# Patient Record
Sex: Male | Born: 1990 | Race: White | Hispanic: No | Marital: Single | Smoking: Never smoker
Health system: Southern US, Community
[De-identification: ages and names within clinical notes are randomized; demographics above are authoritative.]

## PROBLEM LIST (undated history)

## (undated) HISTORY — PX: TONSILLECTOMY: SUR1361

---
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
## 2011-06-24 ENCOUNTER — Emergency Department (HOSPITAL_COMMUNITY): Payer: Medicaid Other

## 2011-06-24 ENCOUNTER — Emergency Department (HOSPITAL_COMMUNITY)
Admission: EM | Admit: 2011-06-24 | Discharge: 2011-06-25 | Disposition: A | Discharge status: Home / Self Care | Payer: Medicaid Other | Attending: Emergency Medicine | Admitting: Emergency Medicine

## 2011-06-24 ENCOUNTER — Encounter: Payer: Self-pay | Admitting: Emergency Medicine

## 2011-06-24 DIAGNOSIS — T148XXA Other injury of unspecified body region, initial encounter: Secondary | ICD-10-CM | POA: Insufficient documentation

## 2011-06-24 DIAGNOSIS — IMO0002 Reserved for concepts with insufficient information to code with codable children: Secondary | ICD-10-CM | POA: Insufficient documentation

## 2011-06-24 DIAGNOSIS — S0990XA Unspecified injury of head, initial encounter: Secondary | ICD-10-CM | POA: Insufficient documentation

## 2011-06-24 MED ORDER — TETANUS-DIPHTH-ACELL PERTUSSIS 5-2.5-18.5 LF-MCG/0.5 IM SUSP
0.5000 mL | Freq: Once | INTRAMUSCULAR | Status: DC
  Filled 2011-06-24: qty 0.5

## 2011-06-24 NOTE — ED Notes (Signed)
Patient has abrasions noted to left temple area and left side of chin reports pain in bilat hands with small abrasions noted and pain in bilat knees.
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
## 2011-06-25 ENCOUNTER — Encounter (HOSPITAL_COMMUNITY): Payer: Self-pay | Admitting: Emergency Medicine

## 2011-06-25 ENCOUNTER — Emergency Department (HOSPITAL_COMMUNITY): Payer: Medicaid Other

## 2011-06-25 MED ORDER — IBUPROFEN 800 MG PO TABS
800.0000 mg | ORAL_TABLET | Freq: Once | ORAL | Status: AC
  Administered 2011-06-25: 800 mg via ORAL
  Filled 2011-06-25: qty 1

## 2011-06-25 NOTE — ED Notes (Signed)
Patient downgraded not trauma per Dr. Manus Gunning

## 2011-06-25 NOTE — ED Notes (Signed)
Abrasions to face chin bilat hands cleaned with NS and dressed with Telfa and tape

## 2016-06-09 ENCOUNTER — Ambulatory Visit (INDEPENDENT_AMBULATORY_CARE_PROVIDER_SITE_OTHER): Payer: 59 | Admitting: Podiatry

## 2016-06-09 ENCOUNTER — Encounter: Payer: Self-pay | Admitting: Podiatry

## 2016-06-09 ENCOUNTER — Ambulatory Visit (INDEPENDENT_AMBULATORY_CARE_PROVIDER_SITE_OTHER): Payer: 59

## 2016-06-09 VITALS — BP 124/83 | HR 71 | Resp 16

## 2016-06-09 DIAGNOSIS — M79671 Pain in right foot: Secondary | ICD-10-CM

## 2016-06-09 DIAGNOSIS — B079 Viral wart, unspecified: Secondary | ICD-10-CM

## 2016-06-09 NOTE — Progress Notes (Signed)
   Subjective:    Patient ID: Adam Dixon, male    DOB: [birth year], 25 y.o.   MRN: [id number]  HPI Chief Complaint  Patient presents with  . Foot Pain    Plantar heel right - small callused area that has become painful over the last 2 months, thick and larger now, no treatment      Review of Systems  Respiratory: Positive for cough.   Allergic/Immunologic: Positive for environmental allergies.  All other systems reviewed and are negative.      Objective:   Physical Exam        Assessment & Plan:

## 2016-06-11 NOTE — Progress Notes (Signed)
Subjective:     Patient ID: Adam BisonNicholas I Dixon, male   DOB: [birth year], 25 y.o.   MRN: [id number]  HPI patient presents with a painful lesion on the plantar aspect of the right foot that he has tried to trim and pad without relief and is been present for a number of months   Review of Systems  All other systems reviewed and are negative.      Objective:   Physical Exam  Constitutional: He is oriented to person, place, and time.  Cardiovascular: Intact distal pulses.   Musculoskeletal: Normal range of motion.  Neurological: He is oriented to person, place, and time.  Skin: Skin is warm.  Nursing note and vitals reviewed.  neurovascular status found to be intact muscle strength adequate range of motion within normal limits with patient noted to have plantar keratotic lesion measuring approximately 1.3 cm x 7 mm that's painful when pressed from a lateral direction with pinpoint bleeding noted upon debridement. Patient's found have good digital perfusion well oriented 3     Assessment:     Verruca plantaris plantar aspect right foot painful and hard for him to walk with    Plan:     Discussed treatment options and I recommended excision. I reviewed the procedure with patient and explained risk and patient wants surgery and today I infiltrated the area with 60 mg Xylocaine with epinephrine applied sterile prep to the area and then utilizing sterile instrumentation remove the mass in toto and took it down to the epidermal dermal junction and applied a small amount of phenol to kill any remaining cells. I placed in formalin sent for pathological evaluation and applied sterile dressing

## 2016-12-05 ENCOUNTER — Ambulatory Visit (INDEPENDENT_AMBULATORY_CARE_PROVIDER_SITE_OTHER): Payer: 59 | Admitting: Family

## 2016-12-05 ENCOUNTER — Encounter (INDEPENDENT_AMBULATORY_CARE_PROVIDER_SITE_OTHER): Payer: Self-pay | Admitting: Family

## 2016-12-05 ENCOUNTER — Ambulatory Visit (INDEPENDENT_AMBULATORY_CARE_PROVIDER_SITE_OTHER): Payer: 59

## 2016-12-05 VITALS — Ht 70.0 in | Wt 170.0 lb

## 2016-12-05 DIAGNOSIS — M25562 Pain in left knee: Secondary | ICD-10-CM

## 2016-12-05 DIAGNOSIS — M25862 Other specified joint disorders, left knee: Secondary | ICD-10-CM

## 2016-12-05 DIAGNOSIS — G8929 Other chronic pain: Secondary | ICD-10-CM

## 2016-12-05 NOTE — Progress Notes (Signed)
   Office Visit Note   Patient: Adam Dixon           Date of Birth: [birth year]           MRN: [id number] Visit Date: 12/05/2016              Requested by: Lonie Peakonroy, Nathan, PA-C [address] [place], [place] [id number] PCP: Lonie Peakonroy, Nathan, PA-C  Chief Complaint  Patient presents with  . Left Knee - Pain      HPI: The patient is a 26 year old gentleman who presents today complaining of left knee pain that is been ongoing for quite some time. States it is anterior no known injury. No swelling. States pain with just some activities at times has pain with ambulation following knee pain. Points to an area where there is some "swelling" proximal to the patella. This is nontender. Does states if he hits it with something heavy this will reproduce his pain and symptoms, will have pain with rom and weight bearing following stricking the cyst.   Assessment & Plan: Visit Diagnoses:  1. Chronic pain of left knee     Plan: reassurance provided. Will consider risks and benefits of surgery. He would like to consider surgical excision. Return as needed.   Follow-Up Instructions: No Follow-up on file.   Left Knee Exam   Tenderness  The patient is experiencing no tenderness.     Range of Motion  The patient has normal left knee ROM.  Tests  Varus: negative Valgus: negative  Other  Erythema: absent Scars: present Swelling: none  Comments:  Cyst over the patellar tendon. This is movable. It is nontender. Is proximally 3 cm x 1 cm. No tenderness no skin changes.   The patellar tendon is intact and is nontender there is no defect. Extensor mechanism intact      Patient is alert, oriented, no adenopathy, well-dressed, normal affect, normal respiratory effort.   Imaging: No results found.  Labs: No results found for: HGBA1C, ESRSEDRATE, CRP, LABURIC, REPTSTATUS, GRAMSTAIN, CULT, LABORGA  Orders:  Orders Placed This Encounter  Procedures  . XR Knee 1-2 Views Left   No  orders of the defined types were placed in this encounter.    Procedures: No procedures performed  Clinical Data: No additional findings.  ROS:  All other systems negative, except as noted in the HPI. Review of Systems  Constitutional: Negative for chills and fever.  Musculoskeletal: Positive for arthralgias and joint swelling. Negative for gait problem.    Objective: Vital Signs: Ht 5\' 10"  (1.778 m)   Wt 170 lb (77.1 kg)   BMI 24.39 kg/m   Specialty Comments:  No specialty comments available.  PMFS History: There are no active problems to display for this patient.  No past medical history on file.  No family history on file.  Past Surgical History:  Procedure Laterality Date  . TONSILLECTOMY     Social History   Occupational History  . Not on file.   Social History Main Topics  . Smoking status: Never Smoker  . Smokeless tobacco: Current User  . Alcohol use Yes  . Drug use: Unknown  . Sexual activity: Not on file

## 2017-02-21 ENCOUNTER — Emergency Department (HOSPITAL_COMMUNITY): Payer: Managed Care, Other (non HMO)

## 2017-02-21 ENCOUNTER — Emergency Department (HOSPITAL_COMMUNITY)
Admission: EM | Admit: 2017-02-21 | Discharge: 2017-02-22 | Disposition: A | Discharge status: Home / Self Care | Payer: Managed Care, Other (non HMO) | Attending: Emergency Medicine | Admitting: Emergency Medicine

## 2017-02-21 ENCOUNTER — Encounter (HOSPITAL_COMMUNITY): Payer: Self-pay

## 2017-02-21 DIAGNOSIS — R0789 Other chest pain: Secondary | ICD-10-CM | POA: Diagnosis not present

## 2017-02-21 DIAGNOSIS — R42 Dizziness and giddiness: Secondary | ICD-10-CM | POA: Diagnosis not present

## 2017-02-21 DIAGNOSIS — R079 Chest pain, unspecified: Secondary | ICD-10-CM | POA: Diagnosis present

## 2017-02-21 LAB — BASIC METABOLIC PANEL
ANION GAP: 9 (ref 5–15)
BUN: 12 mg/dL (ref 6–20)
CALCIUM: 9.5 mg/dL (ref 8.9–10.3)
CO2: 27 mmol/L (ref 22–32)
Chloride: 105 mmol/L (ref 101–111)
Creatinine, Ser: 1.03 mg/dL (ref 0.61–1.24)
Glucose, Bld: 94 mg/dL (ref 65–99)
Potassium: 3.5 mmol/L (ref 3.5–5.1)
SODIUM: 141 mmol/L (ref 135–145)

## 2017-02-21 LAB — CBC
HCT: 41.9 % (ref 39.0–52.0)
Hemoglobin: 14.8 g/dL (ref 13.0–17.0)
MCH: 30.4 pg (ref 26.0–34.0)
MCHC: 35.3 g/dL (ref 30.0–36.0)
MCV: 86 fL (ref 78.0–100.0)
Platelets: 213 10*3/uL (ref 150–400)
RBC: 4.87 MIL/uL (ref 4.22–5.81)
RDW: 12.1 % (ref 11.5–15.5)
WBC: 5.4 10*3/uL (ref 4.0–10.5)

## 2017-02-21 LAB — POCT I-STAT TROPONIN I: TROPONIN I, POC: 0 ng/mL (ref 0.00–0.08)

## 2017-02-21 NOTE — ED Triage Notes (Signed)
Pt complains of chest pain and dizziness He has had this problem before and it's been his potassium level [facility] and has an appt with a GI specialist next week

## 2017-02-21 NOTE — ED Provider Notes (Signed)
[facility]-EMERGENCY DEPT Provider Note   CSN: [id number] Arrival date & time: 02/21/17  2030     History   Chief Complaint Chief Complaint  Patient presents with  . Chest Pain    HPI Adam Dixon is a 26 y.o. male with no significant past medical history who presents today with chief complaint acute onset, intermittent chest pain with associated lightheadedness for 2 days. He states that he achey chest pain. Episodes will last for a few minutes at a time before resolving. Pain moves around. They appear to be worsened with activity but sometimes occur during rest. Today he noted radiation of his symptoms to his left hand. Denies diaphoresis, nausea, vomiting, abdominal pain, vision changes, syncope. He endorses occasional palpitations but denies leg swelling. He also endorses occasional shortness of breath associated with this which can occur at rest or with activity as well as generalized fatigue. He states this feels identical to episodes he has had in the past in which he was told that his potassium was "dangerously high "by his primary care. His caffeine intake includes one cup of coffee daily. He denies IV drug use or recreational drug use. Drinks alcohol socially. He has not tried anything for his symptoms. He notes he is asymptomatic at this time.  The history is provided by the patient.    History reviewed. No pertinent past medical history.  There are no active problems to display for this patient.   Past Surgical History:  Procedure Laterality Date  . TONSILLECTOMY         Home Medications    Prior to Admission medications   Not on File    Family History History reviewed. No pertinent family history.  Social History Social History  Substance Use Topics  . Smoking status: Never Smoker  . Smokeless tobacco: Current User  . Alcohol use Yes     Allergies   Patient has no known allergies.   Review of Systems Review of Systems  Constitutional: Positive for  fatigue. Negative for chills and fever.  Eyes: Negative for visual disturbance.  Respiratory: Positive for shortness of breath.   Cardiovascular: Positive for chest pain and palpitations. Negative for leg swelling.  Gastrointestinal: Negative for abdominal pain, diarrhea and nausea.  Musculoskeletal: Negative for back pain and neck pain.  Neurological: Positive for light-headedness. Negative for syncope, weakness and numbness.  All other systems reviewed and are negative.    Physical Exam Updated Vital Signs BP 134/85 (BP Location: Right Arm)   Pulse 75   Temp 98 F (36.7 C) (Oral)   Resp (!) 23   SpO2 100%   Physical Exam  Constitutional: He is oriented to person, place, and time. He appears well-developed and well-nourished. No distress.  HENT:  Head: Normocephalic and atraumatic.  Right Ear: External ear normal.  Left Ear: External ear normal.  Eyes: Pupils are equal, round, and reactive to light. Conjunctivae are normal. Right eye exhibits no discharge. Left eye exhibits no discharge.  Neck: Normal range of motion. Neck supple. No JVD present. No tracheal deviation present.  Cardiovascular: Normal rate, regular rhythm, normal heart sounds and intact distal pulses.   Physiologic splitting heard, 2+ radial and DP/PT pulses bl, negative Homan's bl   Pulmonary/Chest: Effort normal and breath sounds normal. No respiratory distress. He has no wheezes. He has no rales. He exhibits no tenderness.  Abdominal: Soft. Bowel sounds are normal. He exhibits no distension. There is no tenderness.  Musculoskeletal: Normal range of motion. He exhibits  no edema.  Neurological: He is alert and oriented to person, place, and time. No cranial nerve deficit or sensory deficit.  Skin: Skin is warm and dry. No erythema.  Psychiatric: He has a normal mood and affect. His behavior is normal.  Nursing note and vitals reviewed.    ED Treatments / Results  Labs (all labs ordered are listed, but only  abnormal results are displayed) Labs Reviewed  BASIC METABOLIC PANEL  CBC  TSH  I-STAT TROPONIN, ED  POCT I-STAT TROPONIN I  I-STAT TROPONIN, ED  POCT I-STAT TROPONIN I    EKG  EKG Interpretation  Date/Time:  Wednesday February 21 2017 21:06:53 EDT Ventricular Rate:  70 PR Interval:    QRS Duration: 81 QT Interval:  381 QTC Calculation: 412 R Axis:   72 Text Interpretation:  Sinus rhythm No old tracing to compare Confirmed by Shaune PollackIsaacs, Cameron [phone]([id number]) on 02/21/2017 11:46:38 PM Also confirmed by Shaune PollackIsaacs, Cameron [phone]), editor Barbette Hairassel, Kerry [phone]([id number])  on 02/22/2017 7:19:39 AM       Radiology Dg Chest 2 View  Result Date: 02/21/2017 CLINICAL DATA:  Acute onset of left-sided chest pain. Initial encounter. EXAM: CHEST  2 VIEW COMPARISON:  Chest radiograph performed 06/25/2011 FINDINGS: The lungs are well-aerated and clear. There is no evidence of focal opacification, pleural effusion or pneumothorax. The heart is normal in size; the mediastinal contour is within normal limits. No acute osseous abnormalities are seen. IMPRESSION: No acute cardiopulmonary process seen. Electronically Signed   By: Roanna RaiderJeffery  Chang M.D.   On: 02/21/2017 22:24    Procedures Procedures (including critical care time)  Medications Ordered in ED Medications - No data to display   Initial Impression / Assessment and Plan / ED Course  I have reviewed the triage vital signs and the nursing notes.  Pertinent labs & imaging results that were available during my care of the patient were reviewed by me and considered in my medical decision making (see chart for details).     Patient with intermittent transient chest aching as well as lightheadedness but no syncope. No focal neurological deficits. He is afebrile and his vital signs are stable. EKG shows normal sinus rhythm and is not concerning for arrhythmia, Brugada syndrome, pericarditis, or SVT. Serial troponins are negative and I doubt ACS/MI as the source of his  pains and a young healthy person without risk factors. The remainder of his lab work is unremarkable, including TSH. Chest x-ray shows no acute cardiopulmonary abnormalities and I doubt pneumonia, PE, or pleural effusion. He states in the past he was told his symptoms were due to hyperkalemia, however there is a possibility the specimen was hemolyzed and this was never the case, although I cannot be sure. In any case, his electrolytes are normal today. No further emergent workup required at this time. He is stable for discharge home with follow-up with primary care physician for reevaluation and possible Holter monitoring. Discussed indications for return to the ED. Also discussed  vagal maneuvers to try and the attempts that he is experiencing SVT, limiting caffeine and physical activity until followed by primary care. Patient and patient's father verbalized understanding of and agreement with plan and patient is stable for discharge home at this time.  Final Clinical Impressions(s) / ED Diagnoses   Final diagnoses:  Atypical chest pain  Lightheadedness    New Prescriptions There are no discharge medications for this patient.    Jeanie SewerFawze, Sadira Standard A, PA-C 02/22/17 1446    Shaune PollackIsaacs, Cameron, MD 02/23/17  0915  

## 2017-02-22 LAB — TSH: TSH: 3.909 u[IU]/mL (ref 0.350–4.500)

## 2017-02-22 LAB — POCT I-STAT TROPONIN I: Troponin i, poc: 0 ng/mL (ref 0.00–0.08)

## 2017-02-22 NOTE — Discharge Instructions (Signed)
Follow-up with your primary care physician for reevaluation and possible Holter monitoring. Return to the ED immediately if any concerning signs or symptoms develop.

## 2017-04-19 ENCOUNTER — Other Ambulatory Visit: Payer: Self-pay | Admitting: Physician Assistant

## 2017-04-19 DIAGNOSIS — R109 Unspecified abdominal pain: Secondary | ICD-10-CM

## 2017-04-19 DIAGNOSIS — R17 Unspecified jaundice: Secondary | ICD-10-CM

## 2017-04-25 ENCOUNTER — Ambulatory Visit
Admission: RE | Admit: 2017-04-25 | Discharge: 2017-04-25 | Disposition: A | Discharge status: Home / Self Care | Payer: Managed Care, Other (non HMO) | Source: Ambulatory Visit | Attending: Physician Assistant | Admitting: Physician Assistant

## 2017-04-25 DIAGNOSIS — R109 Unspecified abdominal pain: Secondary | ICD-10-CM

## 2017-04-25 DIAGNOSIS — R17 Unspecified jaundice: Secondary | ICD-10-CM

## 2017-06-13 ENCOUNTER — Other Ambulatory Visit: Payer: Self-pay | Admitting: Physician Assistant

## 2017-06-13 ENCOUNTER — Ambulatory Visit
Admission: RE | Admit: 2017-06-13 | Discharge: 2017-06-13 | Disposition: A | Discharge status: Home / Self Care | Payer: Managed Care, Other (non HMO) | Source: Ambulatory Visit | Attending: Physician Assistant | Admitting: Physician Assistant

## 2017-06-13 DIAGNOSIS — R1031 Right lower quadrant pain: Secondary | ICD-10-CM

## 2017-06-13 MED ORDER — IOPAMIDOL (ISOVUE-300) INJECTION 61%
100.0000 mL | Freq: Once | INTRAVENOUS | Status: AC | PRN
  Administered 2017-06-13: 100 mL via INTRAVENOUS

## 2017-06-15 ENCOUNTER — Other Ambulatory Visit: Payer: Self-pay | Admitting: Physician Assistant

## 2017-06-15 DIAGNOSIS — R1031 Right lower quadrant pain: Secondary | ICD-10-CM

## 2017-06-27 ENCOUNTER — Emergency Department (HOSPITAL_COMMUNITY)
Admission: EM | Admit: 2017-06-27 | Discharge: 2017-06-27 | Disposition: A | Discharge status: Home / Self Care | Payer: Managed Care, Other (non HMO) | Attending: Emergency Medicine | Admitting: Emergency Medicine

## 2017-06-27 ENCOUNTER — Encounter (HOSPITAL_COMMUNITY): Payer: Self-pay | Admitting: Emergency Medicine

## 2017-06-27 DIAGNOSIS — R1011 Right upper quadrant pain: Secondary | ICD-10-CM | POA: Diagnosis present

## 2017-06-27 LAB — COMPREHENSIVE METABOLIC PANEL
ALK PHOS: 53 U/L (ref 38–126)
ALT: 22 U/L (ref 17–63)
ANION GAP: 8 (ref 5–15)
AST: 22 U/L (ref 15–41)
Albumin: 5 g/dL (ref 3.5–5.0)
BUN: 15 mg/dL (ref 6–20)
CALCIUM: 9.6 mg/dL (ref 8.9–10.3)
CO2: 27 mmol/L (ref 22–32)
CREATININE: 0.94 mg/dL (ref 0.61–1.24)
Chloride: 104 mmol/L (ref 101–111)
Glucose, Bld: 97 mg/dL (ref 65–99)
Potassium: 3.7 mmol/L (ref 3.5–5.1)
Sodium: 139 mmol/L (ref 135–145)
TOTAL PROTEIN: 7.5 g/dL (ref 6.5–8.1)
Total Bilirubin: 1.7 mg/dL — ABNORMAL HIGH (ref 0.3–1.2)

## 2017-06-27 LAB — CBC
HCT: 43.9 % (ref 39.0–52.0)
Hemoglobin: 15.5 g/dL (ref 13.0–17.0)
MCH: 31 pg (ref 26.0–34.0)
MCHC: 35.3 g/dL (ref 30.0–36.0)
MCV: 87.8 fL (ref 78.0–100.0)
PLATELETS: 279 10*3/uL (ref 150–400)
RBC: 5 MIL/uL (ref 4.22–5.81)
RDW: 12.3 % (ref 11.5–15.5)
WBC: 10.8 10*3/uL — AB (ref 4.0–10.5)

## 2017-06-27 LAB — URINALYSIS, ROUTINE W REFLEX MICROSCOPIC
BILIRUBIN URINE: NEGATIVE
Glucose, UA: NEGATIVE mg/dL
HGB URINE DIPSTICK: NEGATIVE
KETONES UR: NEGATIVE mg/dL
Leukocytes, UA: NEGATIVE
Nitrite: NEGATIVE
PROTEIN: NEGATIVE mg/dL
Specific Gravity, Urine: 1.003 — ABNORMAL LOW (ref 1.005–1.030)
pH: 8 (ref 5.0–8.0)

## 2017-06-27 LAB — LIPASE, BLOOD: LIPASE: 35 U/L (ref 11–51)

## 2017-06-27 MED ORDER — OXYCODONE HCL 5 MG PO TABS: 5.0000 mg | ORAL_TABLET | Freq: Four times a day (QID) | ORAL | 0 refills | Status: DC | PRN

## 2017-06-27 NOTE — ED Provider Notes (Signed)
[facility]-EMERGENCY DEPT Provider Note: Lowella DellJ. Lane Shown Dissinger, MD, FACEP  CSN: [id number] MRN: [id number] ARRIVAL: 06/27/17 at 1835 ROOM: WA20/WA20   CHIEF COMPLAINT  Abdominal Pain   HISTORY OF PRESENT ILLNESS  06/27/17 10:58 PM Adam Dixon is a 26 y.o. male with several months of intermittent pain in the right upper quadrant.  He has had an unremarkable CT of the abdomen and pelvis this month.  He has had an unremarkable ultrasound of the gallbladder in October.  His lab work has been normal except a mildly elevated bilirubin and there is a question of whether he may have Gilbert's disease.  His pain is described as a sharp pain located in the right upper quadrant with variable radiation to the right flank.  The pain is worse with movement, deep breathing, sneezing, burping or palpation.  The pain is worse after eating.  It is not associated with fever, chills, nausea or vomiting.  He has had diarrhea from time to time.  He is here because the pain acutely worsened earlier this evening.  The pain was severe and had him lying on the garage floor.  It has since eased.    He has been referred to a gastroenterologist but has not yet scheduled an appointment.  [place] [facility] [place] substances [place] reveals the patient has received no opioid prescriptions in the past 2 years.   History reviewed. No pertinent past medical history.  Past Surgical History:  Procedure Laterality Date  . TONSILLECTOMY      No family history on file.  Social History   Tobacco Use  . Smoking status: Never Smoker  . Smokeless tobacco: Current User  Substance Use Topics  . Alcohol use: Yes  . Drug use: No    Prior to Admission medications   Not on File    Allergies Patient has no known allergies.   REVIEW OF SYSTEMS  Negative except as noted here or in the History of Present Illness.   PHYSICAL EXAMINATION  Initial Vital Signs Blood pressure (!) 137/93, pulse  82, temperature 98.2 F (36.8 C), temperature source Oral, resp. rate 16, height 5\' 9"  (1.753 m), weight 77.1 kg (170 lb), SpO2 100 %.  Examination General: Well-developed, well-nourished male in no acute distress; appearance consistent with age of record HENT: normocephalic; atraumatic Eyes: pupils equal, round and reactive to light; extraocular muscles intact Neck: supple Heart: regular rate and rhythm Lungs: clear to auscultation bilaterally Abdomen: soft; nondistended; right upper quadrant tenderness; no masses or hepatosplenomegaly; bowel sounds present Extremities: No deformity; full range of motion Neurologic: Awake, alert and oriented; motor function intact in all extremities and symmetric; no facial droop Skin: Warm and dry Psychiatric: Normal mood and affect   RESULTS  Summary of this visit's results, reviewed by myself:   EKG Interpretation  Date/Time:    Ventricular Rate:    PR Interval:    QRS Duration:   QT Interval:    QTC Calculation:   R Axis:     Text Interpretation:        Laboratory Studies: Results for orders placed or performed during the hospital encounter of 06/27/17 (from the past 24 hour(s))  Lipase, blood     Status: None   Collection Time: 06/27/17  7:51 PM  Result Value Ref Range   Lipase 35 11 - 51 U/L  Comprehensive metabolic panel     Status: Abnormal   Collection Time: 06/27/17  7:51 PM  Result Value Ref Range  Sodium 139 135 - 145 mmol/L   Potassium 3.7 3.5 - 5.1 mmol/L   Chloride 104 101 - 111 mmol/L   CO2 27 22 - 32 mmol/L   Glucose, Bld 97 65 - 99 mg/dL   BUN 15 6 - 20 mg/dL   Creatinine, Ser 4.090.94 0.61 - 1.24 mg/dL   Calcium 9.6 8.9 - 81.110.3 mg/dL   Total Protein 7.5 6.5 - 8.1 g/dL   Albumin 5.0 3.5 - 5.0 g/dL   AST 22 15 - 41 U/L   ALT 22 17 - 63 U/L   Alkaline Phosphatase 53 38 - 126 U/L   Total Bilirubin 1.7 (H) 0.3 - 1.2 mg/dL   GFR calc non Af Amer >60 >60 mL/min   GFR calc Af Amer >60 >60 mL/min   Anion gap 8 5 - 15    CBC     Status: Abnormal   Collection Time: 06/27/17  7:51 PM  Result Value Ref Range   WBC 10.8 (H) 4.0 - 10.5 K/uL   RBC 5.00 4.22 - 5.81 MIL/uL   Hemoglobin 15.5 13.0 - 17.0 g/dL   HCT 91.443.9 78.239.0 - 95.652.0 %   MCV 87.8 78.0 - 100.0 fL   MCH 31.0 26.0 - 34.0 pg   MCHC 35.3 30.0 - 36.0 g/dL   RDW 21.312.3 08.611.5 - 57.815.5 %   Platelets 279 150 - 400 K/uL  Urinalysis, Routine w reflex microscopic     Status: Abnormal   Collection Time: 06/27/17  7:58 PM  Result Value Ref Range   Color, Urine STRAW (A) YELLOW   APPearance CLEAR CLEAR   Specific Gravity, Urine 1.003 (L) 1.005 - 1.030   pH 8.0 5.0 - 8.0   Glucose, UA NEGATIVE NEGATIVE mg/dL   Hgb urine dipstick NEGATIVE NEGATIVE   Bilirubin Urine NEGATIVE NEGATIVE   Ketones, ur NEGATIVE NEGATIVE mg/dL   Protein, ur NEGATIVE NEGATIVE mg/dL   Nitrite NEGATIVE NEGATIVE   Leukocytes, UA NEGATIVE NEGATIVE   Imaging Studies: No results found.  ED COURSE  Nursing notes and initial vitals signs, including pulse oximetry, reviewed.  Vitals:   06/27/17 1850 06/27/17 2227  BP: 136/79 (!) 137/93  Pulse: 89 82  Resp: (!) 22 16  Temp: 98 F (36.7 C) 98.2 F (36.8 C)  TempSrc: Oral Oral  SpO2: 100% 100%  Weight: 77.1 kg (170 lb)   Height: 5\' 9"  (1.753 m)    To the patient was advised of reassuring lab work.  His imaging studies were reviewed.  I do not believe a repeat imaging study would be beneficial at this time.  He was advised that a HIDA scan would likely be the next study of choice.  He will contact his PCP regarding this.  He was also encouraged to follow-up with gastroenterology especially if the HIDA scan is nondiagnostic.  PROCEDURES    ED DIAGNOSES     ICD-10-CM   1. Right upper quadrant abdominal pain R10.11        Ryu Cerreta, Jonny RuizJohn, MD 06/27/17 2327

## 2017-06-27 NOTE — ED Triage Notes (Addendum)
Patient c/o sharp pain from right sided lower back to RLQ. Patient also c/o pain radiating to right shoulder. Denies N/V/D. Denies urinary sx.

## 2017-06-28 ENCOUNTER — Other Ambulatory Visit (HOSPITAL_COMMUNITY): Payer: Self-pay | Admitting: Physician Assistant

## 2017-06-28 DIAGNOSIS — R1011 Right upper quadrant pain: Secondary | ICD-10-CM

## 2017-07-11 ENCOUNTER — Encounter (HOSPITAL_COMMUNITY)
Admission: RE | Admit: 2017-07-11 | Discharge: 2017-07-11 | Disposition: A | Discharge status: Home / Self Care | Payer: Managed Care, Other (non HMO) | Source: Ambulatory Visit | Attending: Physician Assistant | Admitting: Physician Assistant

## 2017-07-11 DIAGNOSIS — R1011 Right upper quadrant pain: Secondary | ICD-10-CM | POA: Diagnosis not present

## 2017-07-11 MED ORDER — TECHNETIUM TC 99M MEBROFENIN IV KIT
5.0000 | PACK | Freq: Once | INTRAVENOUS | Status: AC | PRN
  Administered 2017-07-11: 5 via INTRAVENOUS

## 2017-09-07 HISTORY — PX: ESOPHAGOGASTRODUODENOSCOPY: SHX1529

## 2017-11-21 ENCOUNTER — Ambulatory Visit (INDEPENDENT_AMBULATORY_CARE_PROVIDER_SITE_OTHER): Payer: 59 | Admitting: Orthopedic Surgery

## 2017-11-21 ENCOUNTER — Encounter (INDEPENDENT_AMBULATORY_CARE_PROVIDER_SITE_OTHER): Payer: Self-pay | Admitting: Orthopedic Surgery

## 2017-11-21 VITALS — Ht 70.0 in | Wt 170.0 lb

## 2017-11-21 DIAGNOSIS — R2242 Localized swelling, mass and lump, left lower limb: Secondary | ICD-10-CM

## 2017-11-21 DIAGNOSIS — M25862 Other specified joint disorders, left knee: Secondary | ICD-10-CM

## 2017-11-21 NOTE — Progress Notes (Signed)
   Office Visit Note   Patient: Adam Dixon           Date of Birth: 10/06/1990           MRN: [id number] Visit Date: 11/21/2017              Requested by: Lonie Peak, PA-C [address] [place], [place] [address] PCP: Lonie Peak, PA-C  Chief Complaint  Patient presents with  . Left Knee - Pain      HPI: Patient is a 27 year old gentleman who presents with over 1 year history of a soft mobile mass superior pole of the left patella.  He states he has pain when he crawls pain when he bumps his knee on things states that he is tries to stay active with cycling and that the pain comes and goes.  He states that this time he has full range of motion of his knee without pain but he does have pain with activities.  Patient states he has no pain at night.  Assessment & Plan: Visit Diagnoses:  1. Mass of left knee     Plan: Discussed that we will obtain an MRI scan to identify the extent of the soft tissue mass.  This does have a benign presentation and will follow-up after the MRI scan is obtained for excision of the mass.  Follow-Up Instructions: Return if symptoms worsen or fail to improve.   Ortho Exam  Patient is alert, oriented, no adenopathy, well-dressed, normal affect, normal respiratory effort. Examination patient has a normal gait he has full range of motion of the left knee which is asymptomatic there is no crepitation with range of motion collaterals and cruciates are stable.  Medial lateral joint lines are nontender to palpation.  Patient does have a soft mobile mass over the superior pole of the patella and the quad tendon region which is approximately 10 x 20 cm.  There is no skin color changes no temperature changes no tenderness to palpation there is no knee effusion.  Imaging: No results found. No images are attached to the encounter.  Labs: No results found for: HGBA1C, ESRSEDRATE, CRP, LABURIC, REPTSTATUS, GRAMSTAIN, CULT, LABORGA   Lab Results    Component Value Date   ALBUMIN 5.0 06/27/2017    Body mass index is 24.39 kg/m.  Orders:  No orders of the defined types were placed in this encounter.  No orders of the defined types were placed in this encounter.    Procedures: No procedures performed  Clinical Data: No additional findings.  ROS:  All other systems negative, except as noted in the HPI. Review of Systems  Objective: Vital Signs: Ht  (1.778 m)   Wt 170 lb (77.1 kg)   BMI 24.39 kg/m   Specialty Comments:  No specialty comments available.  PMFS History: There are no active problems to display for this patient.  History reviewed. No pertinent past medical history.  History reviewed. No pertinent family history.  Past Surgical History:  Procedure Laterality Date  . TONSILLECTOMY     Social History   Occupational History  . Not on file  Tobacco Use  . Smoking status: Never Smoker  . Smokeless tobacco: Current User  Substance and Sexual Activity  . Alcohol use: Yes  . Drug use: No  . Sexual activity: Not on file

## 2017-11-25 ENCOUNTER — Ambulatory Visit
Admission: RE | Admit: 2017-11-25 | Discharge: 2017-11-25 | Disposition: A | Discharge status: Home / Self Care | Payer: Managed Care, Other (non HMO) | Source: Ambulatory Visit | Attending: Orthopedic Surgery | Admitting: Orthopedic Surgery

## 2017-11-25 DIAGNOSIS — R2242 Localized swelling, mass and lump, left lower limb: Secondary | ICD-10-CM

## 2017-11-28 ENCOUNTER — Encounter (INDEPENDENT_AMBULATORY_CARE_PROVIDER_SITE_OTHER): Payer: Self-pay | Admitting: Orthopedic Surgery

## 2017-11-28 ENCOUNTER — Ambulatory Visit (INDEPENDENT_AMBULATORY_CARE_PROVIDER_SITE_OTHER): Payer: 59 | Admitting: Orthopedic Surgery

## 2017-11-28 VITALS — Ht 70.0 in | Wt 170.0 lb

## 2017-11-28 DIAGNOSIS — M25862 Other specified joint disorders, left knee: Secondary | ICD-10-CM | POA: Diagnosis not present

## 2017-11-28 DIAGNOSIS — R2242 Localized swelling, mass and lump, left lower limb: Secondary | ICD-10-CM

## 2017-11-28 NOTE — Progress Notes (Signed)
   Office Visit Note   Patient: Adam Dixon           Date of Birth: October 01, 1990           MRN: [id number] Visit Date: 11/28/2017              Requested by: Lonie Peak, PA-C [address] [place], [place] [address] PCP: Lonie Peak, PA-C  Chief Complaint  Patient presents with  . Left Knee - Follow-up    MRI review      HPI: Patient is a 27 year old gentleman with a mobile soft tissue mass superior to the patella left knee.  Patient complains of global pain around the knee complains of numbness over the mass and states that he did not have the symptoms until he had this mass over his knee.  Assessment & Plan: Visit Diagnoses:  1. Mass of left knee     Plan: Discussed with the patient that we could excise the benign soft tissue mass but I do not feel that this mass is related to the knee pain.  Discussed there are risks with surgery including infection neurovascular injury persistent pain numbness around the incision.  Patient states he understands and would like to proceed with excision of the soft tissue mass.  Follow-Up Instructions: Return in about 2 weeks (around 12/12/2017).   Ortho Exam  Patient is alert, oriented, no adenopathy, well-dressed, normal affect, normal respiratory effort. Examination patient has no effusion of the knee and the medial lateral joint line are nontender to palpation in the patellofemoral joint is nontender with range of motion.  There is no effusion.  There is no skin color or temperature changes he has a mobile soft tissue mass superior to the patella superficial to the quad tendon with no skin color or ulcerative changes.  Review of the MRI scan shows a normal MRI scan of the knee.  No signs of malignancy.  Imaging: No results found. No images are attached to the encounter.  Labs: No results found for: HGBA1C, ESRSEDRATE, CRP, LABURIC, REPTSTATUS, GRAMSTAIN, CULT, LABORGA   Lab Results  Component Value Date   ALBUMIN 5.0  06/27/2017    Body mass index is 24.39 kg/m.  Orders:  No orders of the defined types were placed in this encounter.  No orders of the defined types were placed in this encounter.    Procedures: No procedures performed  Clinical Data: No additional findings.  ROS:  All other systems negative, except as noted in the HPI. Review of Systems  Objective: Vital Signs: Ht  (1.778 m)   Wt 170 lb (77.1 kg)   BMI 24.39 kg/m   Specialty Comments:  No specialty comments available.  PMFS History: There are no active problems to display for this patient.  History reviewed. No pertinent past medical history.  History reviewed. No pertinent family history.  Past Surgical History:  Procedure Laterality Date  . TONSILLECTOMY     Social History   Occupational History  . Not on file  Tobacco Use  . Smoking status: Never Smoker  . Smokeless tobacco: Current User  Substance and Sexual Activity  . Alcohol use: Yes  . Drug use: No  . Sexual activity: Not on file

## 2017-12-05 ENCOUNTER — Ambulatory Visit (INDEPENDENT_AMBULATORY_CARE_PROVIDER_SITE_OTHER): Payer: 59 | Admitting: Family

## 2018-05-01 ENCOUNTER — Ambulatory Visit: Payer: 59 | Admitting: Diagnostic Neuroimaging

## 2018-05-01 ENCOUNTER — Encounter

## 2018-07-06 IMAGING — US US ABDOMEN COMPLETE
1 series · 14 of 25 positions shown · non-contrast
Comparison: None.

CLINICAL DATA: Abdominal pain for 1 month.  Hyperbilirubinemia.

EXAM:
ABDOMEN ULTRASOUND COMPLETE

[Series 1: us abdomen complete · 0.17mm/px · 14 of 99 slices shown]
[im 1/99]
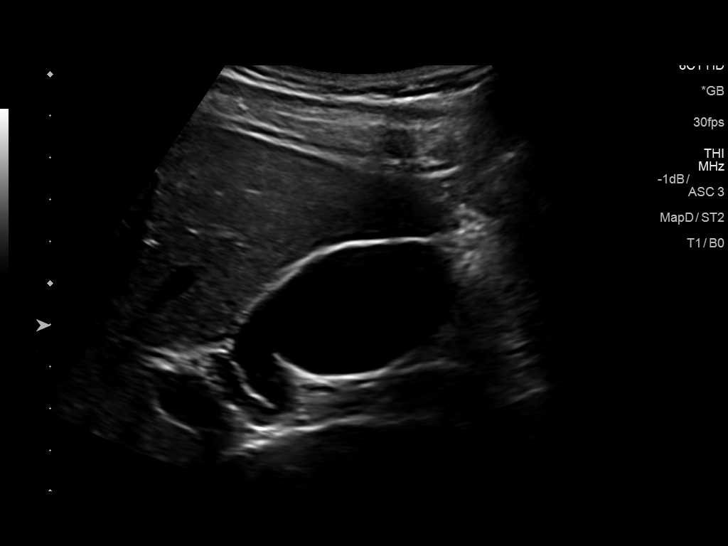
[im 9/99]
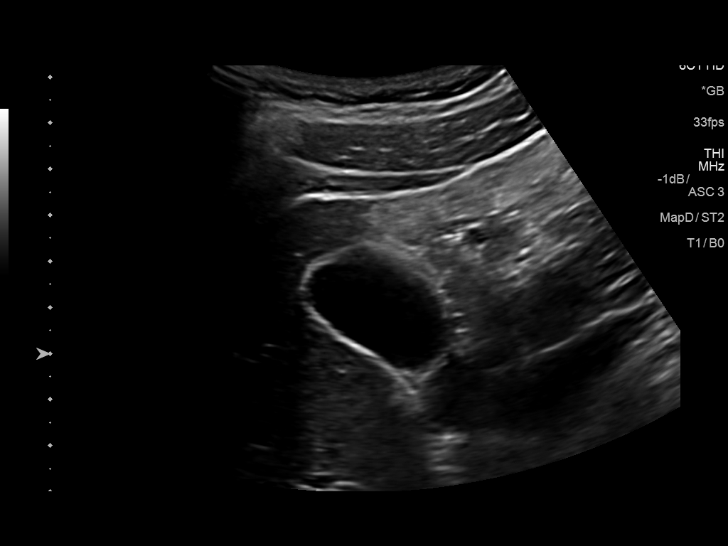
[im 17/99]
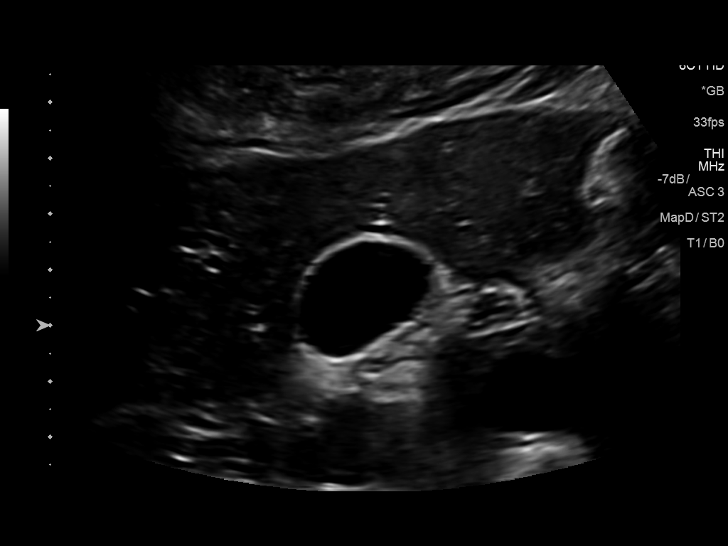
[im 25/99]
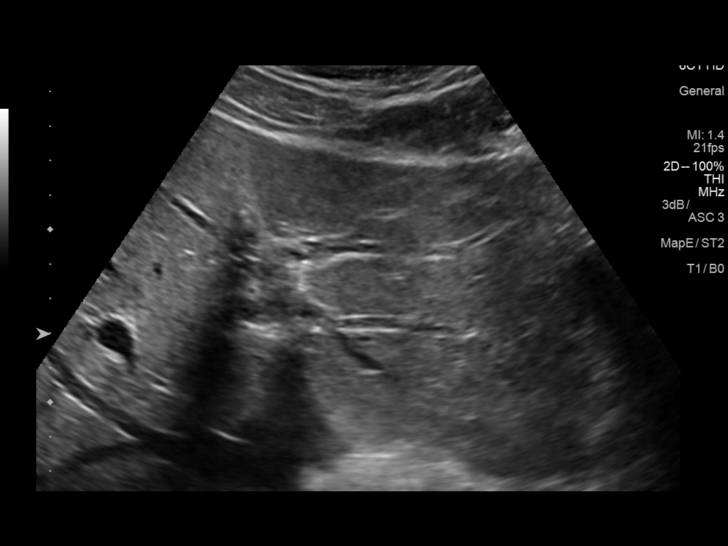
[im 33/99]
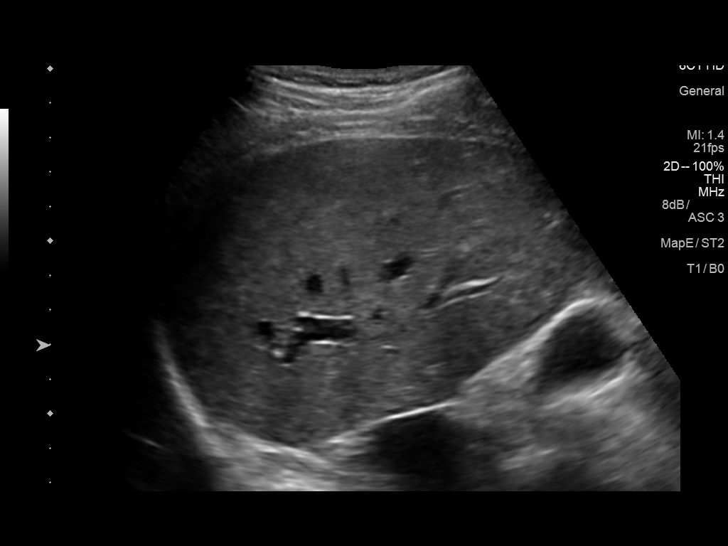
[im 37/99]
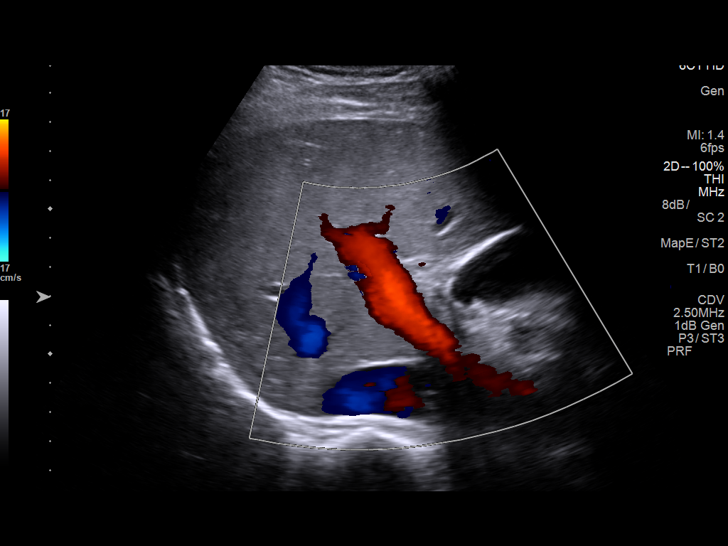
[im 45/99]
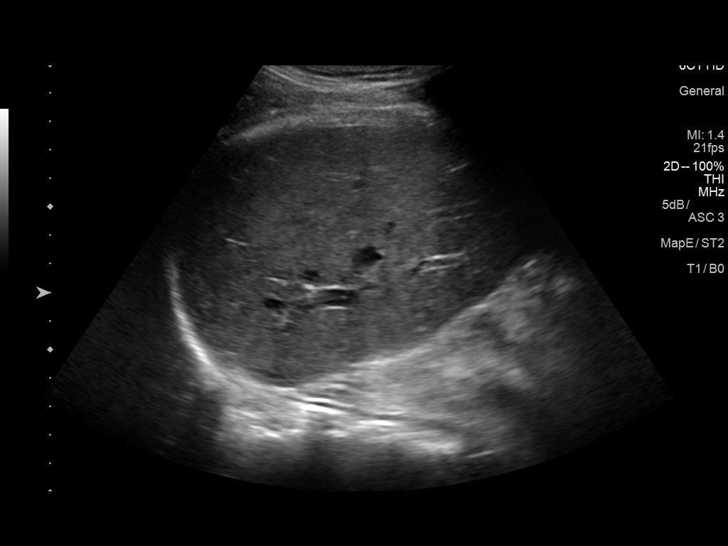
[im 54/99]
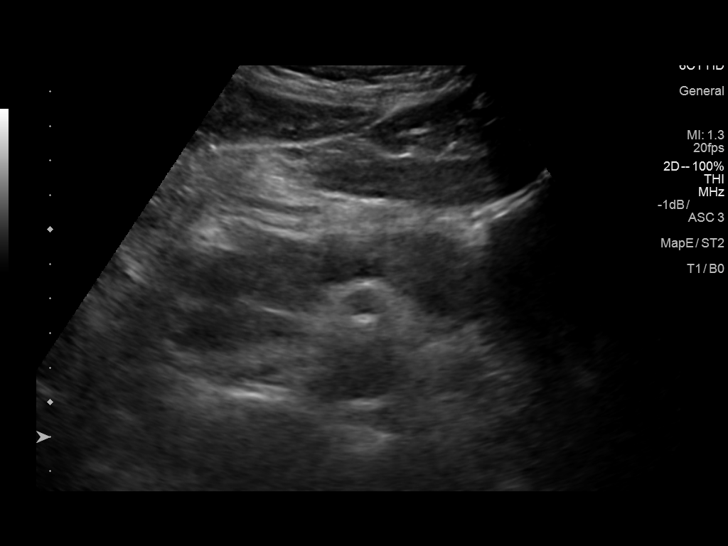
[im 62/99]
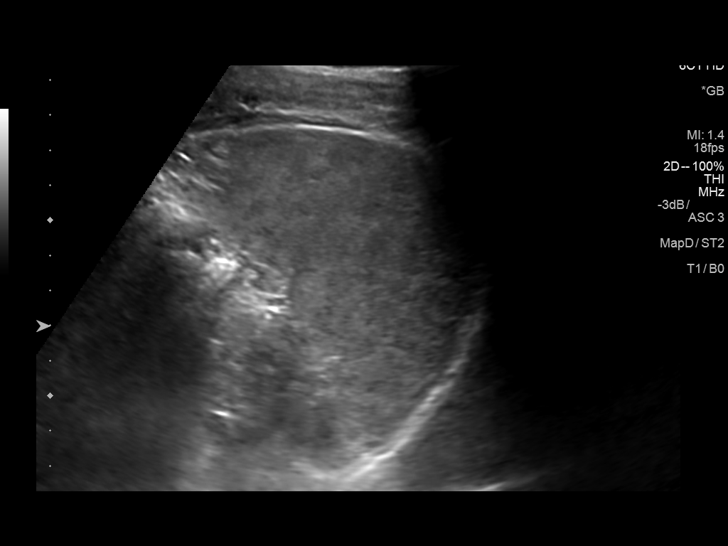
[im 66/99]
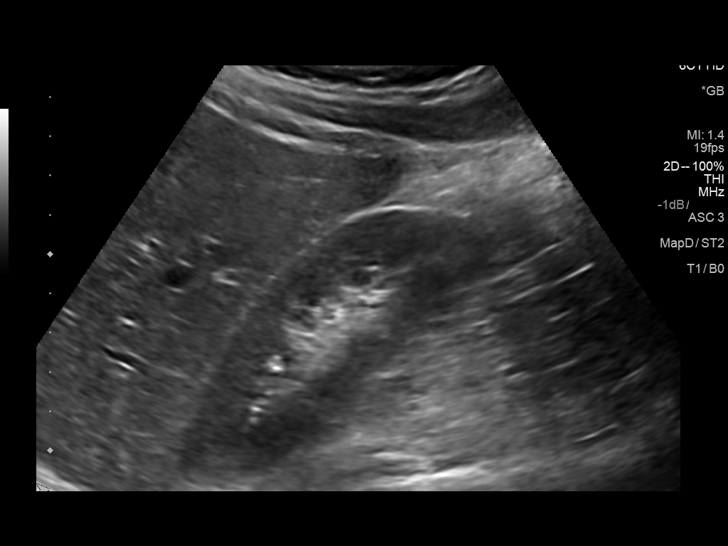
[im 74/99]
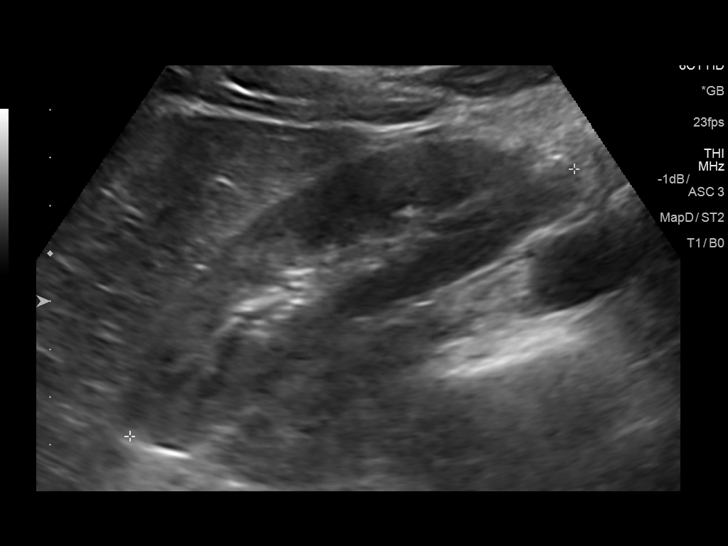
[im 82/99]
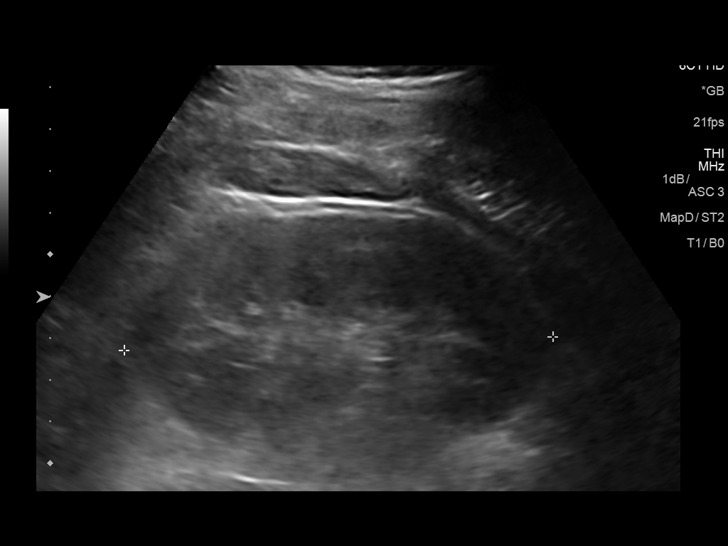
[im 90/99]
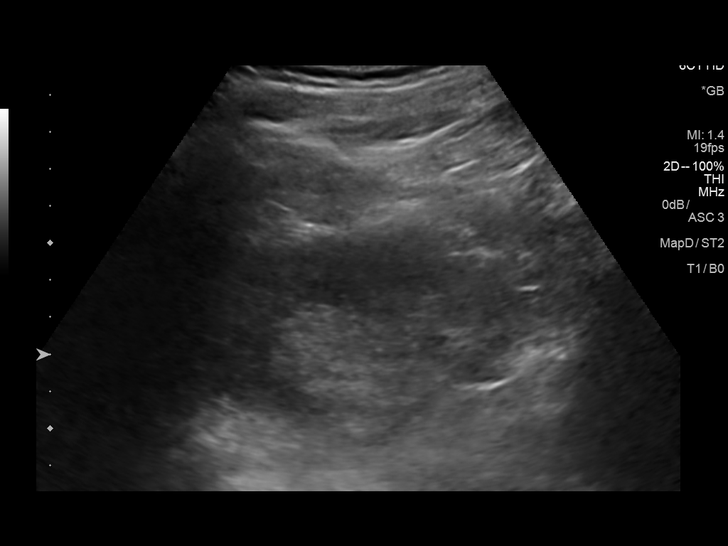
[im 99/99]
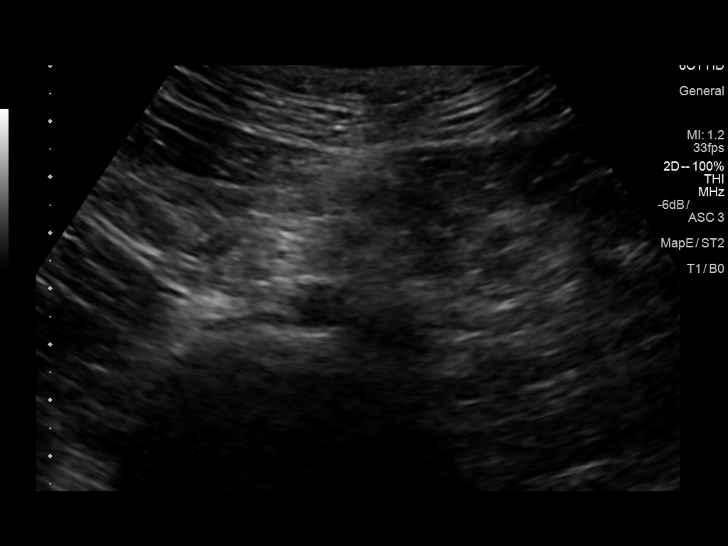

[14 of 25 positions shown; findings below may reference images not displayed]

FINDINGS: Gallbladder: No gallstones or wall thickening visualized. No
sonographic Murphy sign noted by sonographer.

Common bile duct: Diameter: 3 mm, within normal limits.

Liver: No focal lesion identified. Within normal limits in
parenchymal echogenicity. Portal vein is patent on color Doppler
imaging with normal direction of blood flow towards the liver.

IVC: No abnormality visualized.

Pancreas: Visualized portion unremarkable.

Spleen: Size and appearance within normal limits.

Right Kidney: Length: 10.8 cm. Echogenicity within normal limits. No
mass or hydronephrosis visualized.

Left Kidney: Length: 10.3 cm. Echogenicity within normal limits. No
mass or hydronephrosis visualized.

Abdominal aorta: No aneurysm visualized.

Other findings: None.
IMPRESSION: Normal study.

## 2019-05-26 ENCOUNTER — Other Ambulatory Visit: Payer: Self-pay

## 2019-05-26 ENCOUNTER — Ambulatory Visit (INDEPENDENT_AMBULATORY_CARE_PROVIDER_SITE_OTHER): Payer: 59 | Admitting: Podiatry

## 2019-05-26 ENCOUNTER — Encounter: Payer: Self-pay | Admitting: Podiatry

## 2019-05-26 DIAGNOSIS — L600 Ingrowing nail: Secondary | ICD-10-CM

## 2019-05-26 MED ORDER — NEOMYCIN-POLYMYXIN-HC 3.5-10000-1 OT SOLN: OTIC | 0 refills | Status: DC

## 2019-05-26 NOTE — Patient Instructions (Signed)

## 2019-05-28 NOTE — Progress Notes (Signed)
Subjective:   Patient ID: Adam Dixon, male   DOB: 28 y.o.   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  Patient wants procedure and understands risk and signed consent form after review at today I infiltrated the right hallux 60 mg like Marcaine mixture remove the lateral border exposed matrix and applied phenol 3 applications 30 seconds followed by alcohol lavage sterile dressing and gave instructions for soaks.  Patient will be seen back to recheck in the next few weeks and was instructed to leave dressing on 24 hours but take it off earlier if any throbbing were to occur a prescription for

## 2019-05-30 ENCOUNTER — Ambulatory Visit: Payer: 59 | Admitting: Podiatry

## 2019-06-18 ENCOUNTER — Encounter

## 2019-06-18 ENCOUNTER — Ambulatory Visit: Payer: 59 | Admitting: Podiatry

## 2019-09-08 DIAGNOSIS — R109 Unspecified abdominal pain: Secondary | ICD-10-CM | POA: Diagnosis not present

## 2019-09-08 DIAGNOSIS — Z6825 Body mass index (BMI) 25.0-25.9, adult: Secondary | ICD-10-CM | POA: Diagnosis not present

## 2019-09-08 DIAGNOSIS — F419 Anxiety disorder, unspecified: Secondary | ICD-10-CM | POA: Diagnosis not present

## 2019-09-26 DIAGNOSIS — F419 Anxiety disorder, unspecified: Secondary | ICD-10-CM | POA: Diagnosis not present

## 2019-09-26 DIAGNOSIS — H6691 Otitis media, unspecified, right ear: Secondary | ICD-10-CM | POA: Diagnosis not present

## 2019-09-26 DIAGNOSIS — Z6826 Body mass index (BMI) 26.0-26.9, adult: Secondary | ICD-10-CM | POA: Diagnosis not present

## 2019-10-17 DIAGNOSIS — M5386 Other specified dorsopathies, lumbar region: Secondary | ICD-10-CM | POA: Diagnosis not present

## 2019-10-17 DIAGNOSIS — M5417 Radiculopathy, lumbosacral region: Secondary | ICD-10-CM | POA: Diagnosis not present

## 2019-10-17 DIAGNOSIS — M545 Low back pain: Secondary | ICD-10-CM | POA: Diagnosis not present

## 2019-10-17 DIAGNOSIS — M9903 Segmental and somatic dysfunction of lumbar region: Secondary | ICD-10-CM | POA: Diagnosis not present

## 2020-01-26 DIAGNOSIS — F419 Anxiety disorder, unspecified: Secondary | ICD-10-CM | POA: Diagnosis not present

## 2020-01-26 DIAGNOSIS — Z6827 Body mass index (BMI) 27.0-27.9, adult: Secondary | ICD-10-CM | POA: Diagnosis not present

## 2020-01-26 DIAGNOSIS — Z79899 Other long term (current) drug therapy: Secondary | ICD-10-CM | POA: Diagnosis not present

## 2020-01-26 DIAGNOSIS — R109 Unspecified abdominal pain: Secondary | ICD-10-CM | POA: Diagnosis not present

## 2020-07-05 DIAGNOSIS — R0981 Nasal congestion: Secondary | ICD-10-CM | POA: Diagnosis not present

## 2020-07-05 DIAGNOSIS — Z20828 Contact with and (suspected) exposure to other viral communicable diseases: Secondary | ICD-10-CM | POA: Diagnosis not present

## 2020-07-20 DIAGNOSIS — Z79899 Other long term (current) drug therapy: Secondary | ICD-10-CM | POA: Diagnosis not present

## 2020-07-20 DIAGNOSIS — R062 Wheezing: Secondary | ICD-10-CM | POA: Diagnosis not present

## 2020-07-20 DIAGNOSIS — F419 Anxiety disorder, unspecified: Secondary | ICD-10-CM | POA: Diagnosis not present

## 2020-07-20 DIAGNOSIS — J309 Allergic rhinitis, unspecified: Secondary | ICD-10-CM | POA: Diagnosis not present

## 2020-07-20 DIAGNOSIS — R42 Dizziness and giddiness: Secondary | ICD-10-CM | POA: Diagnosis not present

## 2020-08-19 DIAGNOSIS — Z6827 Body mass index (BMI) 27.0-27.9, adult: Secondary | ICD-10-CM | POA: Diagnosis not present

## 2020-08-19 DIAGNOSIS — F419 Anxiety disorder, unspecified: Secondary | ICD-10-CM | POA: Diagnosis not present

## 2020-08-19 DIAGNOSIS — Z1331 Encounter for screening for depression: Secondary | ICD-10-CM | POA: Diagnosis not present

## 2021-02-10 DIAGNOSIS — F419 Anxiety disorder, unspecified: Secondary | ICD-10-CM | POA: Diagnosis not present

## 2021-02-10 DIAGNOSIS — R062 Wheezing: Secondary | ICD-10-CM | POA: Diagnosis not present

## 2021-02-10 DIAGNOSIS — Z79899 Other long term (current) drug therapy: Secondary | ICD-10-CM | POA: Diagnosis not present

## 2021-02-10 DIAGNOSIS — L02414 Cutaneous abscess of left upper limb: Secondary | ICD-10-CM | POA: Diagnosis not present

## 2021-02-10 DIAGNOSIS — J309 Allergic rhinitis, unspecified: Secondary | ICD-10-CM | POA: Diagnosis not present

## 2021-03-18 ENCOUNTER — Encounter: Payer: Self-pay | Admitting: Podiatry

## 2021-03-18 ENCOUNTER — Ambulatory Visit (INDEPENDENT_AMBULATORY_CARE_PROVIDER_SITE_OTHER): Payer: BC Managed Care – PPO | Admitting: Podiatry

## 2021-03-18 ENCOUNTER — Telehealth: Payer: Self-pay

## 2021-03-18 ENCOUNTER — Other Ambulatory Visit: Payer: Self-pay

## 2021-03-18 DIAGNOSIS — L600 Ingrowing nail: Secondary | ICD-10-CM

## 2021-03-18 MED ORDER — DOXYCYCLINE HYCLATE 100 MG PO TABS: 100.0000 mg | ORAL_TABLET | Freq: Two times a day (BID) | ORAL | 0 refills | Status: AC

## 2021-03-18 NOTE — Patient Instructions (Signed)

## 2021-03-18 NOTE — Progress Notes (Signed)
Subjective:   Patient ID: Adam Dixon, male   DOB: 30 y.o.   MRN: [id number]   HPI Patient presents with painful ingrown toenail right big toe stating its been hard for him to wear shoe gear comfortably and he is tried to trim and soak it.  States its been hurting for a few months   ROS      Objective:  Physical Exam  Neurovascular status was found to be intact muscle strength was found to be adequate range of motion adequate with incurvated medial border right hallux painful when pressed with irritation of the bed and no active drainage noted.  Patient was noted to have good digital perfusion well oriented x3     Assessment:  Chronic ingrown toenail of the right hallux medial border with pain     Plan:  H&P condition reviewed recommended correction.  Explained procedure risk and patient wants surgery and signed consent form.  Today I infiltrated the right hallux 60 mg like Marcaine mixture sterile prep done and using sterile instrumentation remove the medial border exposed matrix applied phenol 3 applications 30 seconds followed by alcohol lavage sterile dressing.  Gave instructions on soaks reappoint and encouraged to leave dressing on 24 hours but take it off earlier if any throbbing were to occur

## 2021-03-18 NOTE — Telephone Encounter (Signed)
Pt called and would like to have medication from today's appointment sent to [facility] [address] [place]

## 2021-03-21 NOTE — Telephone Encounter (Signed)
Does not need medicine at this point

## 2021-08-15 DIAGNOSIS — F419 Anxiety disorder, unspecified: Secondary | ICD-10-CM | POA: Diagnosis not present

## 2021-08-15 DIAGNOSIS — Z6828 Body mass index (BMI) 28.0-28.9, adult: Secondary | ICD-10-CM | POA: Diagnosis not present

## 2021-08-15 DIAGNOSIS — J309 Allergic rhinitis, unspecified: Secondary | ICD-10-CM | POA: Diagnosis not present

## 2021-08-15 DIAGNOSIS — R062 Wheezing: Secondary | ICD-10-CM | POA: Diagnosis not present

## 2021-08-15 DIAGNOSIS — Z79899 Other long term (current) drug therapy: Secondary | ICD-10-CM | POA: Diagnosis not present

## 2021-10-11 DIAGNOSIS — Z13220 Encounter for screening for lipoid disorders: Secondary | ICD-10-CM | POA: Diagnosis not present

## 2021-10-11 DIAGNOSIS — J309 Allergic rhinitis, unspecified: Secondary | ICD-10-CM | POA: Diagnosis not present

## 2021-10-11 DIAGNOSIS — F419 Anxiety disorder, unspecified: Secondary | ICD-10-CM | POA: Diagnosis not present

## 2021-10-11 DIAGNOSIS — J4520 Mild intermittent asthma, uncomplicated: Secondary | ICD-10-CM | POA: Diagnosis not present

## 2021-12-22 DIAGNOSIS — M25511 Pain in right shoulder: Secondary | ICD-10-CM | POA: Diagnosis not present

## 2021-12-22 DIAGNOSIS — M7551 Bursitis of right shoulder: Secondary | ICD-10-CM | POA: Diagnosis not present

## 2021-12-22 DIAGNOSIS — M7541 Impingement syndrome of right shoulder: Secondary | ICD-10-CM | POA: Diagnosis not present

## 2022-02-07 DIAGNOSIS — M7542 Impingement syndrome of left shoulder: Secondary | ICD-10-CM | POA: Diagnosis not present

## 2022-02-07 DIAGNOSIS — M7552 Bursitis of left shoulder: Secondary | ICD-10-CM | POA: Diagnosis not present

## 2022-02-07 DIAGNOSIS — M25512 Pain in left shoulder: Secondary | ICD-10-CM | POA: Diagnosis not present

## 2022-02-13 DIAGNOSIS — F419 Anxiety disorder, unspecified: Secondary | ICD-10-CM | POA: Diagnosis not present

## 2022-02-13 DIAGNOSIS — Z1331 Encounter for screening for depression: Secondary | ICD-10-CM | POA: Diagnosis not present

## 2022-02-13 DIAGNOSIS — J4520 Mild intermittent asthma, uncomplicated: Secondary | ICD-10-CM | POA: Diagnosis not present

## 2022-02-13 DIAGNOSIS — J309 Allergic rhinitis, unspecified: Secondary | ICD-10-CM | POA: Diagnosis not present

## 2022-02-13 DIAGNOSIS — Z79899 Other long term (current) drug therapy: Secondary | ICD-10-CM | POA: Diagnosis not present

## 2022-03-02 DIAGNOSIS — F419 Anxiety disorder, unspecified: Secondary | ICD-10-CM | POA: Diagnosis not present

## 2022-03-02 DIAGNOSIS — F32A Depression, unspecified: Secondary | ICD-10-CM | POA: Diagnosis not present

## 2022-03-02 DIAGNOSIS — R42 Dizziness and giddiness: Secondary | ICD-10-CM | POA: Diagnosis not present

## 2022-03-27 DIAGNOSIS — F4010 Social phobia, unspecified: Secondary | ICD-10-CM | POA: Diagnosis not present

## 2022-03-27 DIAGNOSIS — F32A Depression, unspecified: Secondary | ICD-10-CM | POA: Diagnosis not present

## 2022-03-27 DIAGNOSIS — F411 Generalized anxiety disorder: Secondary | ICD-10-CM | POA: Diagnosis not present

## 2022-03-28 DIAGNOSIS — J4520 Mild intermittent asthma, uncomplicated: Secondary | ICD-10-CM | POA: Diagnosis not present

## 2022-03-28 DIAGNOSIS — F419 Anxiety disorder, unspecified: Secondary | ICD-10-CM | POA: Diagnosis not present

## 2022-03-28 DIAGNOSIS — J309 Allergic rhinitis, unspecified: Secondary | ICD-10-CM | POA: Diagnosis not present

## 2022-03-28 DIAGNOSIS — F32A Depression, unspecified: Secondary | ICD-10-CM | POA: Diagnosis not present

## 2022-04-04 DIAGNOSIS — F411 Generalized anxiety disorder: Secondary | ICD-10-CM | POA: Diagnosis not present

## 2022-04-04 DIAGNOSIS — F4010 Social phobia, unspecified: Secondary | ICD-10-CM | POA: Diagnosis not present

## 2022-04-04 DIAGNOSIS — F32A Depression, unspecified: Secondary | ICD-10-CM | POA: Diagnosis not present

## 2022-04-12 DIAGNOSIS — F411 Generalized anxiety disorder: Secondary | ICD-10-CM | POA: Diagnosis not present

## 2022-04-12 DIAGNOSIS — F32A Depression, unspecified: Secondary | ICD-10-CM | POA: Diagnosis not present

## 2022-04-12 DIAGNOSIS — F4010 Social phobia, unspecified: Secondary | ICD-10-CM | POA: Diagnosis not present

## 2022-04-19 DIAGNOSIS — F411 Generalized anxiety disorder: Secondary | ICD-10-CM | POA: Diagnosis not present

## 2022-04-19 DIAGNOSIS — F32A Depression, unspecified: Secondary | ICD-10-CM | POA: Diagnosis not present

## 2022-04-19 DIAGNOSIS — F4010 Social phobia, unspecified: Secondary | ICD-10-CM | POA: Diagnosis not present

## 2022-04-26 DIAGNOSIS — F411 Generalized anxiety disorder: Secondary | ICD-10-CM | POA: Diagnosis not present

## 2022-04-26 DIAGNOSIS — F4010 Social phobia, unspecified: Secondary | ICD-10-CM | POA: Diagnosis not present

## 2022-04-26 DIAGNOSIS — F32A Depression, unspecified: Secondary | ICD-10-CM | POA: Diagnosis not present

## 2022-05-03 DIAGNOSIS — F411 Generalized anxiety disorder: Secondary | ICD-10-CM | POA: Diagnosis not present

## 2022-05-03 DIAGNOSIS — F32A Depression, unspecified: Secondary | ICD-10-CM | POA: Diagnosis not present

## 2022-05-03 DIAGNOSIS — F4010 Social phobia, unspecified: Secondary | ICD-10-CM | POA: Diagnosis not present

## 2022-05-10 DIAGNOSIS — F32A Depression, unspecified: Secondary | ICD-10-CM | POA: Diagnosis not present

## 2022-05-10 DIAGNOSIS — F4010 Social phobia, unspecified: Secondary | ICD-10-CM | POA: Diagnosis not present

## 2022-05-10 DIAGNOSIS — F411 Generalized anxiety disorder: Secondary | ICD-10-CM | POA: Diagnosis not present

## 2022-05-17 DIAGNOSIS — F4010 Social phobia, unspecified: Secondary | ICD-10-CM | POA: Diagnosis not present

## 2022-05-17 DIAGNOSIS — F411 Generalized anxiety disorder: Secondary | ICD-10-CM | POA: Diagnosis not present

## 2022-05-17 DIAGNOSIS — F32A Depression, unspecified: Secondary | ICD-10-CM | POA: Diagnosis not present

## 2022-05-24 DIAGNOSIS — F4010 Social phobia, unspecified: Secondary | ICD-10-CM | POA: Diagnosis not present

## 2022-05-24 DIAGNOSIS — F411 Generalized anxiety disorder: Secondary | ICD-10-CM | POA: Diagnosis not present

## 2022-05-24 DIAGNOSIS — F32A Depression, unspecified: Secondary | ICD-10-CM | POA: Diagnosis not present

## 2022-05-31 DIAGNOSIS — F32A Depression, unspecified: Secondary | ICD-10-CM | POA: Diagnosis not present

## 2022-05-31 DIAGNOSIS — F4010 Social phobia, unspecified: Secondary | ICD-10-CM | POA: Diagnosis not present

## 2022-05-31 DIAGNOSIS — F411 Generalized anxiety disorder: Secondary | ICD-10-CM | POA: Diagnosis not present

## 2022-06-07 DIAGNOSIS — F32A Depression, unspecified: Secondary | ICD-10-CM | POA: Diagnosis not present

## 2022-06-07 DIAGNOSIS — F4010 Social phobia, unspecified: Secondary | ICD-10-CM | POA: Diagnosis not present

## 2022-06-07 DIAGNOSIS — F411 Generalized anxiety disorder: Secondary | ICD-10-CM | POA: Diagnosis not present

## 2022-06-14 DIAGNOSIS — F411 Generalized anxiety disorder: Secondary | ICD-10-CM | POA: Diagnosis not present

## 2022-06-14 DIAGNOSIS — F4010 Social phobia, unspecified: Secondary | ICD-10-CM | POA: Diagnosis not present

## 2022-06-14 DIAGNOSIS — F32A Depression, unspecified: Secondary | ICD-10-CM | POA: Diagnosis not present

## 2022-06-21 DIAGNOSIS — F4010 Social phobia, unspecified: Secondary | ICD-10-CM | POA: Diagnosis not present

## 2022-06-21 DIAGNOSIS — F32A Depression, unspecified: Secondary | ICD-10-CM | POA: Diagnosis not present

## 2022-06-21 DIAGNOSIS — F411 Generalized anxiety disorder: Secondary | ICD-10-CM | POA: Diagnosis not present

## 2023-02-25 DIAGNOSIS — S40011A Contusion of right shoulder, initial encounter: Secondary | ICD-10-CM | POA: Diagnosis not present

## 2023-02-28 DIAGNOSIS — S46911A Strain of unspecified muscle, fascia and tendon at shoulder and upper arm level, right arm, initial encounter: Secondary | ICD-10-CM | POA: Diagnosis not present

## 2023-03-15 DIAGNOSIS — S46911D Strain of unspecified muscle, fascia and tendon at shoulder and upper arm level, right arm, subsequent encounter: Secondary | ICD-10-CM | POA: Diagnosis not present

## 2023-05-11 DIAGNOSIS — D696 Thrombocytopenia, unspecified: Secondary | ICD-10-CM | POA: Diagnosis not present

## 2023-05-11 DIAGNOSIS — R17 Unspecified jaundice: Secondary | ICD-10-CM | POA: Diagnosis not present

## 2023-05-30 DIAGNOSIS — D696 Thrombocytopenia, unspecified: Secondary | ICD-10-CM | POA: Diagnosis not present

## 2023-09-07 DIAGNOSIS — H52223 Regular astigmatism, bilateral: Secondary | ICD-10-CM | POA: Diagnosis not present

## 2023-09-07 DIAGNOSIS — H33322 Round hole, left eye: Secondary | ICD-10-CM | POA: Diagnosis not present

## 2023-09-19 DIAGNOSIS — M25511 Pain in right shoulder: Secondary | ICD-10-CM | POA: Diagnosis not present
